# Patient Record
Sex: Male | Born: 2000
Health system: Southern US, Community
[De-identification: ages and names within clinical notes are randomized; demographics above are authoritative.]

---
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
## 2020-09-23 IMAGING — US US CAROTID DUPLEX BILAT
1 series · 13 of 24 positions shown · non-contrast
Comparison: None.

CLINICAL DATA: Right asymptomatic bruit on physical exam

EXAM:
BILATERAL CAROTID DUPLEX ULTRASOUND
TECHNIQUE: Gray scale imaging, color Doppler and duplex ultrasound were
performed of bilateral carotid and vertebral arteries in the neck.

[Series 1: us carotid duplex bilat · 0.07mm/px · 13 of 70 slices shown]
[im 1/70]
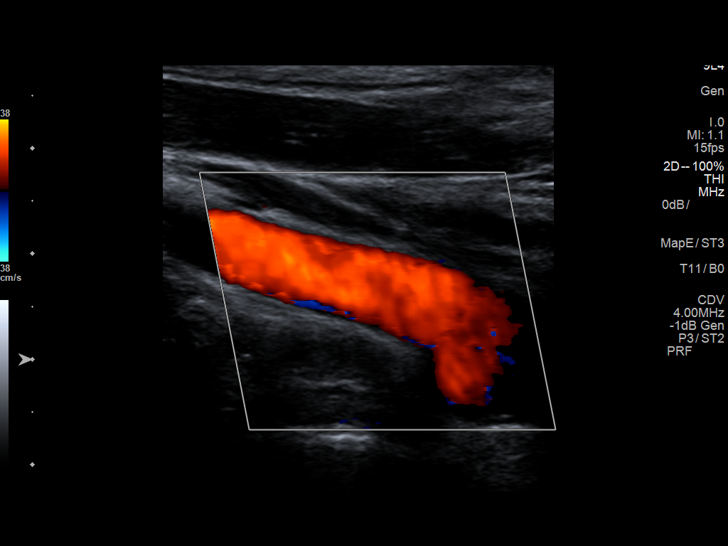
[im 7/70]
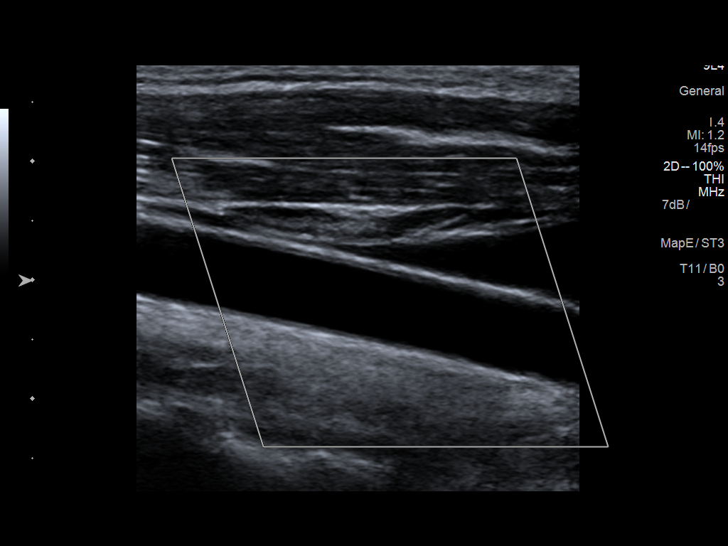
[im 13/70]
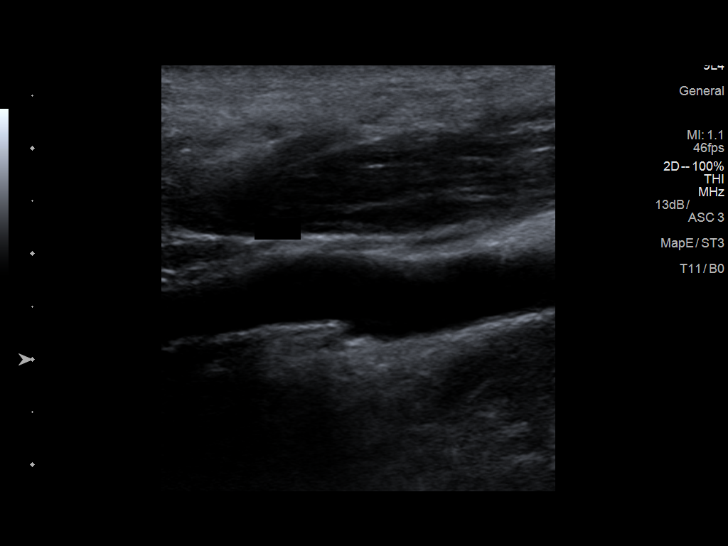
[im 19/70]
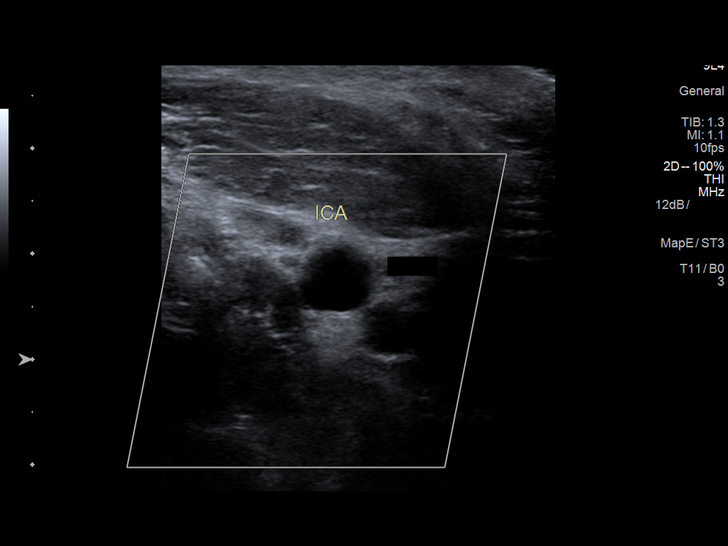
[im 25/70]
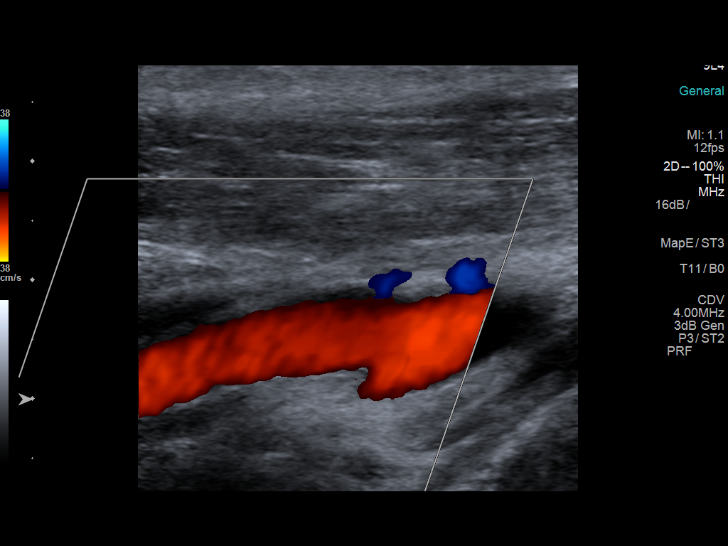
[im 31/70]
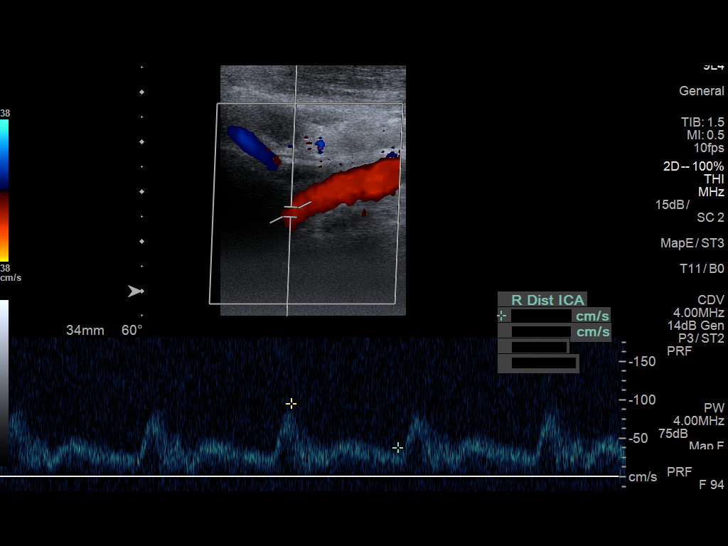
[im 37/70]
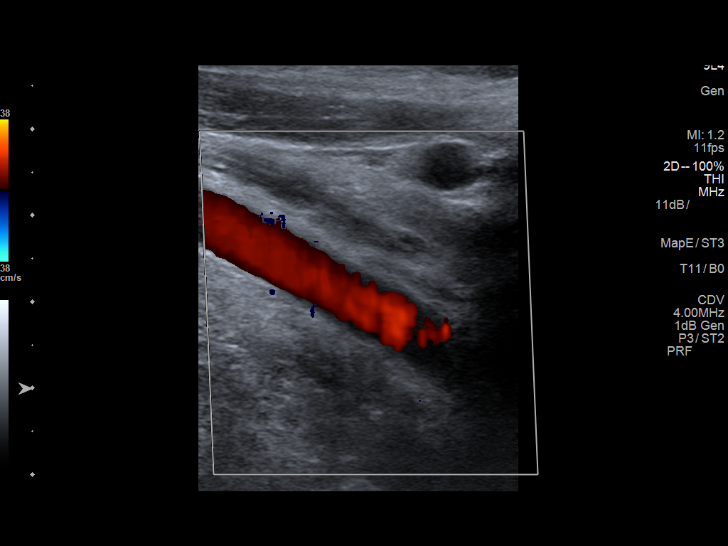
[im 40/70]
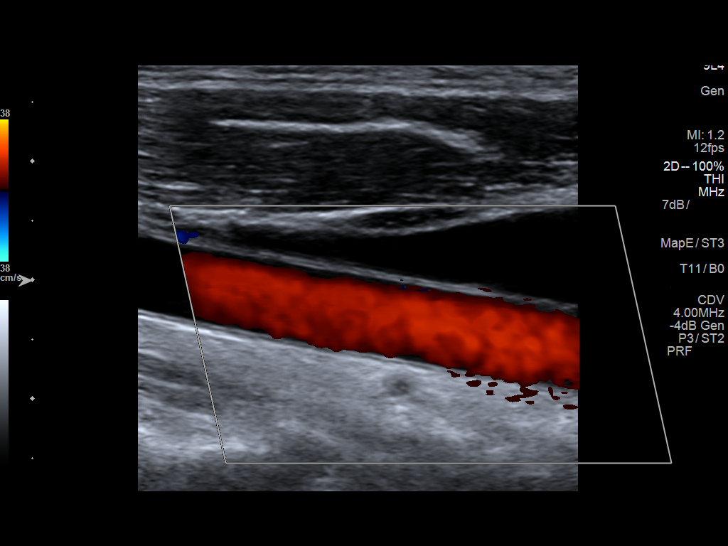
[im 46/70]
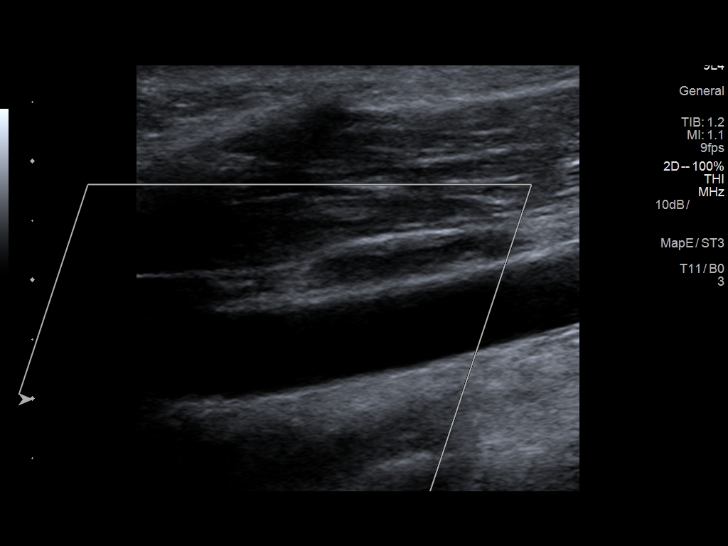
[im 52/70]
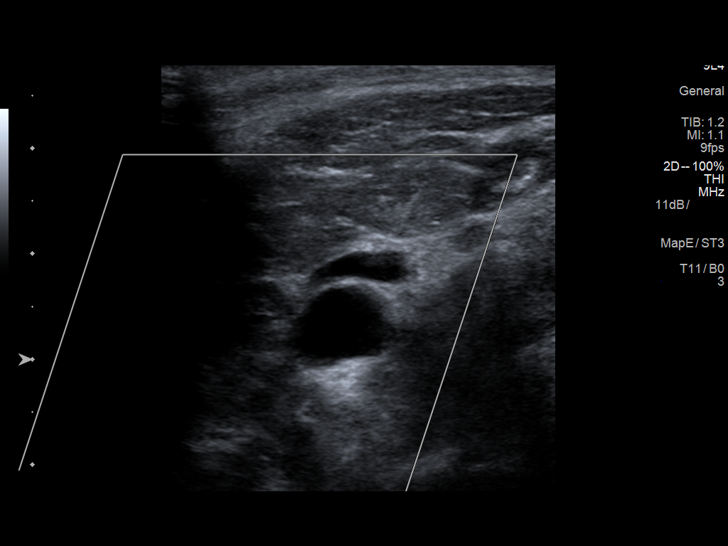
[im 58/70]
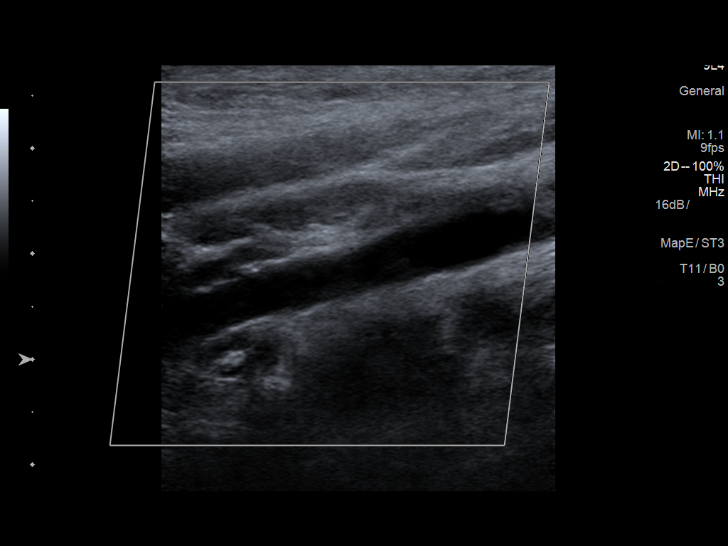
[im 64/70]
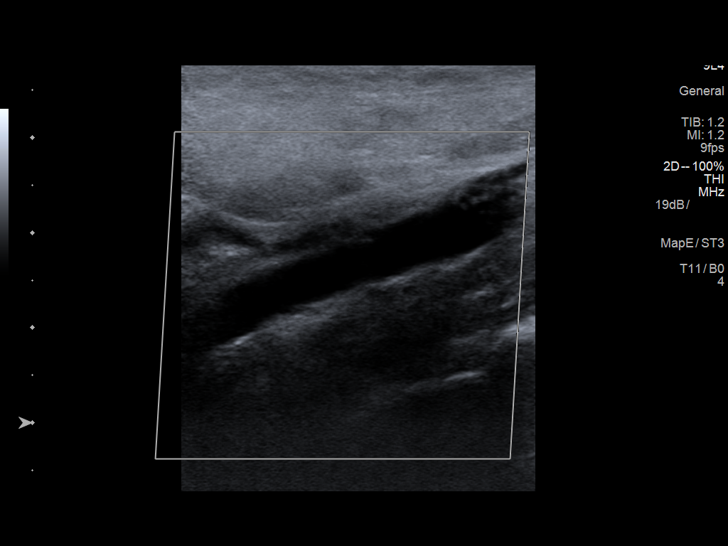
[im 70/70]
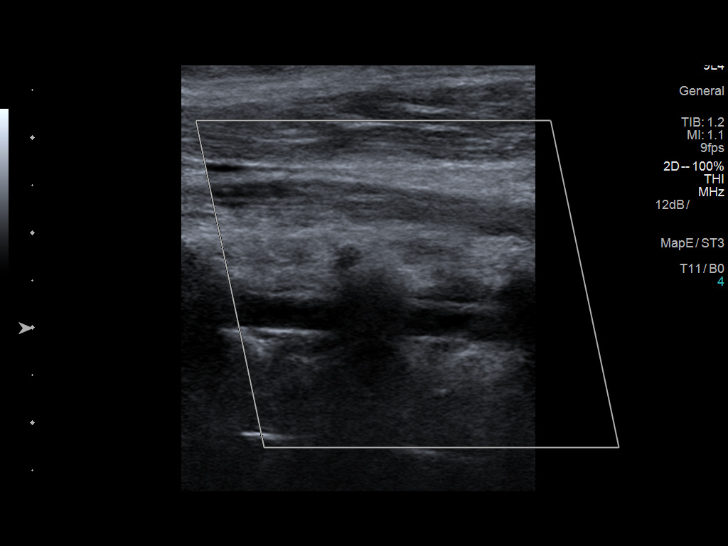

[13 of 24 positions shown; findings below may reference images not displayed]

FINDINGS: Criteria: Quantification of carotid stenosis is based on velocity
parameters that correlate the residual internal carotid diameter
with NASCET-based stenosis levels, using the diameter of the distal
internal carotid lumen as the denominator for stenosis measurement.

The following velocity measurements were obtained:

RIGHT

ICA: 132/29 cm/sec

CCA: 198/38 cm/sec

SYSTOLIC ICA/CCA RATIO:

ECA: 144 cm/sec

LEFT

ICA: 112/26 cm/sec

CCA: 182/30 cm/sec

SYSTOLIC ICA/CCA RATIO:

ECA: 135 cm/sec

RIGHT CAROTID ARTERY: No plaque or stenosis. Normal waveforms and
color Doppler signal.

RIGHT VERTEBRAL ARTERY:  Normal flow direction and waveform.

LEFT CAROTID ARTERY: No plaque or stenosis. Normal waveforms and
color Doppler signal.

LEFT VERTEBRAL ARTERY:  Normal flow direction and waveform.

Upper extremity blood pressures: RIGHT: 129/74 LEFT: 127/57
IMPRESSION: 1. No significant carotid plaque or stenosis.
2.  Antegrade bilateral vertebral arterial flow.

## 2022-04-21 DIAGNOSIS — Z0000 Encounter for general adult medical examination without abnormal findings: Secondary | ICD-10-CM | POA: Diagnosis not present

## 2022-04-21 DIAGNOSIS — Z23 Encounter for immunization: Secondary | ICD-10-CM | POA: Diagnosis not present
